# Patient Record
Sex: Female | Born: 1940 | Race: Black or African American | Hispanic: No | Marital: Single | Smoking: Current some day smoker
Health system: Southern US, Community
[De-identification: ages and names within clinical notes are randomized; demographics above are authoritative.]

## PROBLEM LIST (undated history)

## (undated) DIAGNOSIS — E7800 Pure hypercholesterolemia, unspecified: Secondary | ICD-10-CM

## (undated) DIAGNOSIS — R739 Hyperglycemia, unspecified: Secondary | ICD-10-CM

## (undated) DIAGNOSIS — I10 Essential (primary) hypertension: Secondary | ICD-10-CM

## (undated) DIAGNOSIS — M8580 Other specified disorders of bone density and structure, unspecified site: Secondary | ICD-10-CM

## (undated) DIAGNOSIS — Z8742 Personal history of other diseases of the female genital tract: Secondary | ICD-10-CM

## (undated) HISTORY — PX: APPENDECTOMY: SHX54

## (undated) HISTORY — PX: CATARACT EXTRACTION: SUR2

## (undated) HISTORY — DX: Hyperglycemia, unspecified: R73.9

## (undated) HISTORY — DX: Other specified disorders of bone density and structure, unspecified site: M85.80

## (undated) HISTORY — DX: Personal history of other diseases of the female genital tract: Z87.42

## (undated) HISTORY — DX: Pure hypercholesterolemia, unspecified: E78.00

## (undated) HISTORY — PX: MYOMECTOMY: SHX85

## (undated) HISTORY — DX: Essential (primary) hypertension: I10

---

## 1982-06-10 HISTORY — PX: ABDOMINAL HYSTERECTOMY: SHX81

## 2001-04-02 ENCOUNTER — Encounter: Payer: Self-pay | Admitting: Obstetrics and Gynecology

## 2001-04-02 ENCOUNTER — Encounter: Admission: RE | Admit: 2001-04-02 | Discharge: 2001-04-02 | Payer: Self-pay | Admitting: Obstetrics and Gynecology

## 2002-06-01 ENCOUNTER — Encounter: Admission: RE | Admit: 2002-06-01 | Discharge: 2002-06-01 | Payer: Self-pay | Admitting: Obstetrics and Gynecology

## 2002-06-01 ENCOUNTER — Encounter: Payer: Self-pay | Admitting: Obstetrics and Gynecology

## 2004-04-26 ENCOUNTER — Encounter: Admission: RE | Admit: 2004-04-26 | Discharge: 2004-04-26 | Payer: Self-pay | Admitting: Family Medicine

## 2004-09-25 ENCOUNTER — Encounter (HOSPITAL_COMMUNITY): Admission: RE | Admit: 2004-09-25 | Discharge: 2004-12-24 | Payer: Self-pay | Admitting: Family Medicine

## 2004-12-21 ENCOUNTER — Ambulatory Visit (HOSPITAL_COMMUNITY): Admission: RE | Admit: 2004-12-21 | Discharge: 2004-12-21 | Payer: Self-pay | Admitting: Gastroenterology

## 2005-07-22 ENCOUNTER — Encounter: Admission: RE | Admit: 2005-07-22 | Discharge: 2005-07-22 | Payer: Self-pay | Admitting: Obstetrics and Gynecology

## 2006-07-24 ENCOUNTER — Encounter: Admission: RE | Admit: 2006-07-24 | Discharge: 2006-07-24 | Payer: Self-pay | Admitting: Obstetrics and Gynecology

## 2007-07-30 ENCOUNTER — Encounter: Admission: RE | Admit: 2007-07-30 | Discharge: 2007-07-30 | Payer: Self-pay | Admitting: Obstetrics and Gynecology

## 2008-08-23 ENCOUNTER — Encounter: Admission: RE | Admit: 2008-08-23 | Discharge: 2008-08-23 | Payer: Self-pay | Admitting: Obstetrics and Gynecology

## 2009-08-24 ENCOUNTER — Encounter: Admission: RE | Admit: 2009-08-24 | Discharge: 2009-08-24 | Payer: Self-pay | Admitting: Obstetrics and Gynecology

## 2010-02-21 ENCOUNTER — Encounter: Admission: RE | Admit: 2010-02-21 | Discharge: 2010-03-09 | Payer: Self-pay | Admitting: Family Medicine

## 2010-07-25 ENCOUNTER — Other Ambulatory Visit: Payer: Self-pay | Admitting: Obstetrics and Gynecology

## 2010-07-25 DIAGNOSIS — Z1231 Encounter for screening mammogram for malignant neoplasm of breast: Secondary | ICD-10-CM

## 2010-08-28 ENCOUNTER — Ambulatory Visit
Admission: RE | Admit: 2010-08-28 | Discharge: 2010-08-28 | Disposition: A | Discharge status: Home / Self Care | Payer: Medicare Other | Source: Ambulatory Visit | Attending: Obstetrics and Gynecology | Admitting: Obstetrics and Gynecology

## 2010-08-28 DIAGNOSIS — Z1231 Encounter for screening mammogram for malignant neoplasm of breast: Secondary | ICD-10-CM

## 2011-07-03 ENCOUNTER — Encounter: Payer: Self-pay | Admitting: Gynecology

## 2011-07-03 ENCOUNTER — Ambulatory Visit (INDEPENDENT_AMBULATORY_CARE_PROVIDER_SITE_OTHER): Payer: Medicare Other | Admitting: Gynecology

## 2011-07-03 VITALS — BP 120/72 | Ht 66.5 in | Wt 195.0 lb

## 2011-07-03 DIAGNOSIS — Z79890 Hormone replacement therapy (postmenopausal): Secondary | ICD-10-CM | POA: Diagnosis not present

## 2011-07-03 DIAGNOSIS — N952 Postmenopausal atrophic vaginitis: Secondary | ICD-10-CM

## 2011-07-03 DIAGNOSIS — N9089 Other specified noninflammatory disorders of vulva and perineum: Secondary | ICD-10-CM | POA: Diagnosis not present

## 2011-07-03 DIAGNOSIS — R739 Hyperglycemia, unspecified: Secondary | ICD-10-CM | POA: Insufficient documentation

## 2011-07-03 DIAGNOSIS — B369 Superficial mycosis, unspecified: Secondary | ICD-10-CM

## 2011-07-03 DIAGNOSIS — Z8742 Personal history of other diseases of the female genital tract: Secondary | ICD-10-CM | POA: Insufficient documentation

## 2011-07-03 DIAGNOSIS — E7800 Pure hypercholesterolemia, unspecified: Secondary | ICD-10-CM | POA: Insufficient documentation

## 2011-07-03 DIAGNOSIS — Z780 Asymptomatic menopausal state: Secondary | ICD-10-CM

## 2011-07-03 MED ORDER — NYSTATIN-TRIAMCINOLONE 100000-0.1 UNIT/GM-% EX OINT: TOPICAL_OINTMENT | Freq: Two times a day (BID) | CUTANEOUS | Status: AC

## 2011-07-03 MED ORDER — ESTROGENS CONJUGATED 0.3 MG PO TABS: 0.3000 mg | ORAL_TABLET | Freq: Every day | ORAL | Status: AC

## 2011-07-03 NOTE — Progress Notes (Signed)
Denise Wilson 02-04-1941 [id number]        71 y.o.  New patient for follow up. Former patient of Dr. Leota Sauers. Several issues as noted below. 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  Past medical history,surgical history, medications, allergies, family history and social history were all reviewed and documented in the EPIC chart. ROS:  Was performed and pertinent positives and negatives are included in the history.  Exam: Sherrilyn Rist chaperone present Filed Vitals:   07/03/11 1411  BP: 120/72   General appearance  Normal Skin grossly normal Head/Neck normal with no cervical or supraclavicular adenopathy thyroid normal Lungs  clear Cardiac RR, without RMG Abdominal  soft, nontender, without masses, organomegaly or hernia Breasts  examined lying and sitting without masses, retractions, discharge or axillary adenopathy.  Under each breast classic yeast dermatitis. Pelvic  Ext/BUS/vagina  normal with atrophic changes. Small skin tag right lower labia majora  Adnexa  Without masses or tenderness    Anus and perineum  normal   Rectovaginal  normal sphincter tone without palpated masses or tenderness.    Assessment/Plan:  71 y.o. female for follow up.    1. Classic yeast dermatitis under both breasts. Treat with Mytrex cream nightly as needed. Follow up if persists 2. Atrophic vaginitis, asymptomatic. We'll continue to follow. 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 It is not a bother to the patient she'll continue to monitor. 4. ERT. Patient on low-dose Premarin 0.3 mg daily. Although she admits to not taking it daily. I reviewed the whole issue of ERT, WHI study with increased risk of stroke heart attack DVT possible breast cancer. The ACOG and NAMS statements for lowest dose for shortest period of time. I recommend she try to wean off and see how she does. I did refill times a year at her request just  in case she would start to have symptoms would want to restart this and she accepts the above risks. 5. Pap smear. No Pap smear was done today. She has numerous normal Pap smear results in her chart the last one in 2011. She has no history of abnormal Pap smears before.  She is status post hysterectomy for benign indication it is over 65. I reviewed current screening guidelines we both agreed to stop Pap smears at this time. 6. Mammography. She is due for mammography in March and I reminded her to schedule this. SBE monthly reviewed. 7. Colonoscopy. She had her colonoscopy 4-5 years ago and she will repeat this at the recommended interval. 8. Bone health. She's never had a bone density I scheduled one today. Increase calcium vitamin D reviewed. 9. Health maintenance. She's been followed for a number of medical conditions and actively sees her primary for this. The blood work was drawn today as is all done through their office.    Dara Lords MD, 2:49 PM 07/03/2011

## 2011-07-03 NOTE — Patient Instructions (Signed)
Follow up for bone density as scheduled.  Weaned from estrogen as we discussed.  Consider Stop Smoking.  Help is available at [facility] smoking cessation program @ [email or web address] or (514) 209-4842. OR 1-800-QUIT-NOW 979-551-6586) for free smoking cessation counseling.  Return for checkup in 1 year

## 2011-07-09 ENCOUNTER — Ambulatory Visit (INDEPENDENT_AMBULATORY_CARE_PROVIDER_SITE_OTHER): Payer: Medicare Other

## 2011-07-09 ENCOUNTER — Encounter: Payer: Self-pay | Admitting: Gynecology

## 2011-07-09 DIAGNOSIS — Z780 Asymptomatic menopausal state: Secondary | ICD-10-CM

## 2011-07-09 DIAGNOSIS — M899 Disorder of bone, unspecified: Secondary | ICD-10-CM

## 2011-07-09 DIAGNOSIS — M949 Disorder of cartilage, unspecified: Secondary | ICD-10-CM | POA: Diagnosis not present

## 2011-07-09 DIAGNOSIS — M8580 Other specified disorders of bone density and structure, unspecified site: Secondary | ICD-10-CM

## 2011-07-09 HISTORY — DX: Other specified disorders of bone density and structure, unspecified site: M85.80

## 2011-07-10 ENCOUNTER — Encounter: Payer: Self-pay | Admitting: Gynecology

## 2011-07-10 ENCOUNTER — Telehealth: Payer: Self-pay | Admitting: Gynecology

## 2011-07-10 DIAGNOSIS — M8580 Other specified disorders of bone density and structure, unspecified site: Secondary | ICD-10-CM

## 2011-07-10 NOTE — Telephone Encounter (Signed)
Pt informed with the below note and will make appointment. 

## 2011-07-10 NOTE — Telephone Encounter (Signed)
Tell patient that her bone density shows osteopenia approaching osteoporosis. I recommended she check a vitamin D level and then make an appointment to see me so that we can talk about whether we want to consider treatment or not.  I put the order in for the vitamin D level

## 2011-07-11 ENCOUNTER — Encounter: Payer: Self-pay | Admitting: Gynecology

## 2011-07-11 ENCOUNTER — Ambulatory Visit (INDEPENDENT_AMBULATORY_CARE_PROVIDER_SITE_OTHER): Payer: Medicare Other | Admitting: Gynecology

## 2011-07-11 DIAGNOSIS — M8580 Other specified disorders of bone density and structure, unspecified site: Secondary | ICD-10-CM

## 2011-07-11 DIAGNOSIS — M899 Disorder of bone, unspecified: Secondary | ICD-10-CM

## 2011-07-11 LAB — COMPREHENSIVE METABOLIC PANEL
Albumin: 4.7 g/dL (ref 3.5–5.2)
Chloride: 102 mEq/L (ref 96–112)
Sodium: 135 mEq/L (ref 135–145)
Total Protein: 8.3 g/dL (ref 6.0–8.3)

## 2011-07-11 NOTE — Progress Notes (Signed)
Patient presents to discuss her DEXA report. This was the first DEXA she ever had and it showed a T score of -2.4 at the left hip. FRAX showed a 10 year probability of fracture for major osteoporotic at 6% and hip fracture 2%. I reviewed all this with her. She does smoke and I reviewed the contributing factors of this. She does take extra vitamin D. The issues of whether to treat or not treat with a -2.4 T score but a favorable FRAX were reviewed and the risks benefits of treatments discussed. She does note that she was told that her calcium level was high when checked in her blood and I went ahead and ordered a vitamin D level PTH TSH and comprehensive metabolic panel. Patient will follow up for these results.  If all normal she'll continue with her extra vitamin D and we will plan on repeat DEXA in 2 years for a trending and then discuss treatment. Patient's comfortable with this plan.

## 2011-07-11 NOTE — Patient Instructions (Signed)
Follow up for laboratory studies. Assuming they are all normal then we'll plan on repeating the bone density test in 2 years

## 2011-07-15 ENCOUNTER — Telehealth: Payer: Self-pay | Admitting: *Deleted

## 2011-07-15 NOTE — Telephone Encounter (Signed)
Message copied by Libby Maw on Mon Jul 15, 2011  3:44 PM ------      Message from: Dara Lords      Created: Mon Jul 15, 2011 11:10 AM       Hyland Mollenkopf, tell patient that her one blood study that looks at her parathyroid hormone level was elevated. This sometimes can come from small benign tumors in the parathyroid. I want you to schedule a Sestamibi scan through nuclear medicine re: elevated parathyroid hormone and calcium level rule out parathyroid adenoma. Tell patient that I will ultimately be referring her to Dr. Darnell Level who is a surgeon who specializes in removing these types of tumors you can also go ahead and set up appointment to see him after the date the nuclear study is scheduled.

## 2011-07-15 NOTE — Telephone Encounter (Signed)
Lm for patient to call about results. 

## 2011-07-16 ENCOUNTER — Telehealth: Payer: Self-pay | Admitting: *Deleted

## 2011-07-16 NOTE — Telephone Encounter (Signed)
Message copied by Libby Maw on Tue Jul 16, 2011  9:14 AM ------      Message from: Dara Lords      Created: Mon Jul 15, 2011 11:10 AM       Nusrat Encarnacion, tell patient that her one blood study that looks at her parathyroid hormone level was elevated. This sometimes can come from small benign tumors in the parathyroid. I want you to schedule a Sestamibi scan through nuclear medicine re: elevated parathyroid hormone and calcium level rule out parathyroid adenoma. Tell patient that I will ultimately be referring her to Dr. Darnell Level who is a surgeon who specializes in removing these types of tumors you can also go ahead and set up appointment to see him after the date the nuclear study is scheduled.

## 2011-07-16 NOTE — Telephone Encounter (Signed)
Patient informed the information below.  Patient does not want to proceed with anything until she see's Dr. Parke Simmers her PCP.  I told patient it was very important to get this done as soon as possible.  Patient said she would call me back with her answer.

## 2011-07-17 ENCOUNTER — Other Ambulatory Visit: Payer: Self-pay | Admitting: *Deleted

## 2011-07-17 ENCOUNTER — Encounter: Payer: Self-pay | Admitting: Gynecology

## 2011-07-17 DIAGNOSIS — R7989 Other specified abnormal findings of blood chemistry: Secondary | ICD-10-CM

## 2011-07-17 NOTE — Telephone Encounter (Signed)
Patient called and decided to go with the scan after speaking with her PCP.  Scheduled scan for 07/24/11 @ 9:45 [facility].  We will await results per TF to see if she will need surgeon or endocrinologist.  Patient informed

## 2011-07-24 ENCOUNTER — Encounter (HOSPITAL_COMMUNITY)
Admission: RE | Admit: 2011-07-24 | Discharge: 2011-07-24 | Disposition: A | Discharge status: Home / Self Care | Payer: Medicare Other | Source: Ambulatory Visit | Attending: Gynecology | Admitting: Gynecology

## 2011-07-24 ENCOUNTER — Ambulatory Visit (HOSPITAL_COMMUNITY)
Admission: RE | Admit: 2011-07-24 | Discharge: 2011-07-24 | Disposition: A | Discharge status: Home / Self Care | Payer: Medicare Other | Source: Ambulatory Visit | Attending: Gynecology | Admitting: Gynecology

## 2011-07-24 DIAGNOSIS — R7989 Other specified abnormal findings of blood chemistry: Secondary | ICD-10-CM

## 2011-07-24 DIAGNOSIS — R946 Abnormal results of thyroid function studies: Secondary | ICD-10-CM | POA: Insufficient documentation

## 2011-07-24 DIAGNOSIS — E349 Endocrine disorder, unspecified: Secondary | ICD-10-CM | POA: Diagnosis not present

## 2011-07-24 MED ORDER — TECHNETIUM TC 99M SESTAMIBI - CARDIOLITE
25.0000 | Freq: Once | INTRAVENOUS | Status: AC | PRN
  Administered 2011-07-24: 10:00:00 25 via INTRAVENOUS

## 2011-08-01 ENCOUNTER — Other Ambulatory Visit: Payer: Self-pay | Admitting: *Deleted

## 2011-08-01 DIAGNOSIS — D351 Benign neoplasm of parathyroid gland: Secondary | ICD-10-CM

## 2011-08-15 DIAGNOSIS — H2510 Age-related nuclear cataract, unspecified eye: Secondary | ICD-10-CM | POA: Diagnosis not present

## 2011-08-15 DIAGNOSIS — H31019 Macula scars of posterior pole (postinflammatory) (post-traumatic), unspecified eye: Secondary | ICD-10-CM | POA: Diagnosis not present

## 2011-08-21 DIAGNOSIS — H2510 Age-related nuclear cataract, unspecified eye: Secondary | ICD-10-CM | POA: Diagnosis not present

## 2011-08-26 ENCOUNTER — Other Ambulatory Visit (INDEPENDENT_AMBULATORY_CARE_PROVIDER_SITE_OTHER): Payer: Self-pay | Admitting: Surgery

## 2011-08-26 ENCOUNTER — Encounter (INDEPENDENT_AMBULATORY_CARE_PROVIDER_SITE_OTHER): Payer: Self-pay | Admitting: Surgery

## 2011-08-26 ENCOUNTER — Ambulatory Visit (INDEPENDENT_AMBULATORY_CARE_PROVIDER_SITE_OTHER): Payer: Medicare Other | Admitting: Surgery

## 2011-08-26 VITALS — BP 132/74 | HR 64 | Temp 98.3°F | Resp 18 | Ht 67.0 in | Wt 196.6 lb

## 2011-08-26 DIAGNOSIS — R221 Localized swelling, mass and lump, neck: Secondary | ICD-10-CM | POA: Diagnosis not present

## 2011-08-26 DIAGNOSIS — E210 Primary hyperparathyroidism: Secondary | ICD-10-CM | POA: Diagnosis not present

## 2011-08-26 DIAGNOSIS — R220 Localized swelling, mass and lump, head: Secondary | ICD-10-CM | POA: Diagnosis not present

## 2011-08-26 DIAGNOSIS — D351 Benign neoplasm of parathyroid gland: Secondary | ICD-10-CM

## 2011-08-26 DIAGNOSIS — E215 Disorder of parathyroid gland, unspecified: Secondary | ICD-10-CM

## 2011-08-26 DIAGNOSIS — K118 Other diseases of salivary glands: Secondary | ICD-10-CM | POA: Insufficient documentation

## 2011-08-26 NOTE — Progress Notes (Signed)
Chief Complaint  Patient presents with  . New Evaluation    eval primary hyperparathyroidism - referral from Dr. Caesar Chestnut    HISTORY: Patient is a 71 year old black female with a long-standing history of hypercalcemia. Patient was recently evaluated by her gynecologist. Laboratory studies showed an elevated serum calcium level of 11.5. Intact PTH level was markedly elevated at 191.0. The patient underwent a bone density scan which was normal. Patient was then referred for nuclear medicine parathyroid scan which localized an area of increased activity to the left inferior position. Patient is referred to general surgery at this time for consideration for minimally invasive parathyroidectomy.  Patient has no history of prior head or neck surgery. She does note a mass in the area of the angle of the mandible on the left. This has occurred in the past. He seems to be exacerbated by sour foods. She has not had this previously evaluated.  Past Medical History  Diagnosis Date  . Hypertension   . High cholesterol   . Elevated blood sugar   . History of PID   . Osteopenia 07/09/2011    t score -2.4     Current Outpatient Prescriptions  Medication Sig Dispense Refill  . amLODipine-benazepril (LOTREL) 10-20 MG per capsule Take 1 capsule by mouth daily.      Marland Kitchen BROMDAY 0.09 % SOLN daily.      . calcium carbonate (OS-CAL) 1250 MG chewable tablet Chew 1 tablet by mouth daily.      . cholecalciferol (VITAMIN D) 1000 UNITS tablet Take 1,000 Units by mouth daily.      . COD LIVER OIL PO Take by mouth.      . estrogens, conjugated, (PREMARIN) 0.3 MG tablet Take 1 tablet (0.3 mg total) by mouth daily. Take daily for 21 days then do not take for 7 days.  30 tablet  11  . fish oil-omega-3 fatty acids 1000 MG capsule Take 2 g by mouth daily.      Marland Kitchen losartan-hydrochlorothiazide (HYZAAR) 100-25 MG per tablet Take 1 tablet by mouth daily.      Marland Kitchen nystatin-triamcinolone ointment (MYCOLOG) Apply topically 2  (two) times daily.  30 g  0  . ofloxacin (OCUFLOX) 0.3 % ophthalmic solution daily.      . rosuvastatin (CRESTOR) 5 MG tablet Take 5 mg by mouth daily.      . sitaGLIPtin (JANUVIA) 100 MG tablet Take 100 mg by mouth daily.      . Vitamin D, Ergocalciferol, (DRISDOL) 50000 UNITS CAPS Take 50,000 Units by mouth.         No Known Allergies   Family History  Problem Relation Age of Onset  . Hypertension Father   . Cancer Maternal Uncle     [place]     History   Social History  . Marital Status: Single    Spouse Name: N/A    Number of Children: N/A  . Years of Education: N/A   Social History Main Topics  . Smoking status: Current Some Day Smoker  . Smokeless tobacco: Never Used  . Alcohol Use: No  . Drug Use: No  . Sexually Active: No   Other Topics Concern  . None   Social History Narrative  . None     REVIEW OF SYSTEMS - PERTINENT POSITIVES ONLY: Denies nephrolithiasis. Denies fatigue. Denies bone or joint pain.  EXAM: Filed Vitals:   08/26/11 0932  BP: 132/74  Pulse: 64  Temp: 98.3 F (36.8 C)  Resp: 18  HEENT: normocephalic; pupils equal and reactive; sclerae clear; dentition good; mucous membranes moist NECK:  Palpable enlargement of the tail of the left parotid gland with mild tenderness. No discrete mass. No significant lymphadenopathy; symmetric on extension; no palpable anterior or posterior cervical lymphadenopathy; no supraclavicular masses; no tenderness CHEST: clear to auscultation bilaterally without rales, rhonchi, or wheezes CARDIAC: regular rate and rhythm without significant murmur; peripheral pulses are full ABDOMEN: soft without distension; bowel sounds present; no mass; no hepatosplenomegaly; no hernia EXT:  non-tender without edema; no deformity NEURO: no gross focal deficits; mild tremor in right hand only   LABORATORY RESULTS: See [facility] (CHL-Epic) for most recent results   RADIOLOGY RESULTS: See [facility]  (CHL-Epic) for most recent results   IMPRESSION: #1 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 I will order an MRI scan of the neck to better evaluate the left parotid mass as well as confirm the presence of a left inferior parathyroid adenoma. Once the study is completed I will review the results and contact the patient. If there is a significant lesion in the left parotid gland, I will make referral to ENT for further evaluation and recommendations.  Patient will require minimally invasive parathyroidectomy, likely a left inferior parathyroid adenoma. We will not schedule this procedure until the left parotid mass is more formally evaluated. There may be the possibility of a combined procedure.  Velora Heckler, MD, FACS General & Endocrine Surgery [facility]   Visit Diagnoses: 1. Hyperparathyroidism, primary   2. Mass of parotid gland, left     Primary Care Physician: Geraldo Pitter, MD, MD  Gyn:  Dr. Edyth Gunnels

## 2011-08-26 NOTE — Patient Instructions (Signed)
[facility] [phone]  THYROID & PARATHYROID SURGERY -- POST OP INSTRUCTIONS  Always review your discharge instruction sheet from the facility where your surgery was performed.  1. A prescription for pain medication may be given to you upon discharge.  Take your pain medication as prescribed, if needed.  If narcotic pain medicine is not needed, then you may take acetaminophen (Tylenol) or ibuprofen (Advil) as needed. 2. Take your usually prescribed medications unless otherwise directed. 3. If you need a refill on your pain medication, please contact your pharmacy. They will contact our office to request authorization.  Prescriptions will not be processed after 5 pm or on weekends. 4. Start with a light diet upon arrival home, such as soup and crackers, etc.  Be sure to drink pleny of fluids daily.  Resume your normal diet the day after surgery. 5. Most patients will experience some swelling and bruising on the chest and neck area.  Ice packs will help.  Swelling and bruising can take several days to resolve.  6. It is common to experience some constipation if taking pain medication after surgery.  Increasing fluid intake and taking a stool softener will usually help or prevent this problem.  A mild laxative (Milk of Magnesia or Miralax) should be taken according to package directions if there are no bowel movements after 48 hours. 7. You may remove your bandages 24-48 hours after surgery, and you may shower at that time.  You have steri-strips (small skin tapes) in place directly over the incision.  These strips should be left on the skin for 7-10 days and then removed. 8. You may resume regular (light) daily activities beginning the next day-such as daily self-care, walking, climbing stairs-gradually increasing activities as tolerated.  You may have sexual intercourse when it is comfortable.  Refrain from any heavy lifting or straining until approved by your doctor.  You may drive  when you no longer are taking prescription pain medication, you can comfortably wear a seatbelt, and you can safely maneuver your car and apply brakes. 9. You should see your doctor in the office for a follow-up appointment approximately two weeks after your surgery.  Make sure that you call for this appointment within a day or two after you arrive home to insure a convenient appointment time.  WHEN TO CALL YOUR DOCTOR: 1. Fever over 101.5 2. Inability to urinate 3. Nausea and/or vomiting - persistent 4. Extreme swelling or bruising 5. Continued bleeding from incision 6. Increased pain, redness, or drainage from the incision 7. Difficulty swallowing or breathing 8. Muscle cramping or spasms 9. Numbness or tingling in hands or feet or around lips  The clinic staff is available to answer your questions during regular business hours.  Please don't hesitate to call and ask to speak to one of the nurses if you have concerns.  [email or web address]   

## 2011-08-30 ENCOUNTER — Other Ambulatory Visit: Payer: Medicare Other

## 2011-09-17 DIAGNOSIS — H35059 Retinal neovascularization, unspecified, unspecified eye: Secondary | ICD-10-CM | POA: Diagnosis not present

## 2011-09-17 DIAGNOSIS — H359 Unspecified retinal disorder: Secondary | ICD-10-CM | POA: Diagnosis not present

## 2011-09-17 DIAGNOSIS — H3560 Retinal hemorrhage, unspecified eye: Secondary | ICD-10-CM | POA: Diagnosis not present

## 2011-09-17 DIAGNOSIS — H353290 Exudative age-related macular degeneration, unspecified eye, stage unspecified: Secondary | ICD-10-CM | POA: Diagnosis not present

## 2011-09-18 DIAGNOSIS — H3560 Retinal hemorrhage, unspecified eye: Secondary | ICD-10-CM | POA: Diagnosis not present

## 2011-09-18 DIAGNOSIS — H353290 Exudative age-related macular degeneration, unspecified eye, stage unspecified: Secondary | ICD-10-CM | POA: Diagnosis not present

## 2011-09-18 DIAGNOSIS — H35059 Retinal neovascularization, unspecified, unspecified eye: Secondary | ICD-10-CM | POA: Diagnosis not present

## 2011-09-24 DIAGNOSIS — E119 Type 2 diabetes mellitus without complications: Secondary | ICD-10-CM | POA: Diagnosis not present

## 2011-09-24 DIAGNOSIS — R4182 Altered mental status, unspecified: Secondary | ICD-10-CM | POA: Diagnosis not present

## 2011-09-24 DIAGNOSIS — I10 Essential (primary) hypertension: Secondary | ICD-10-CM | POA: Diagnosis not present

## 2011-09-24 DIAGNOSIS — E1110 Type 2 diabetes mellitus with ketoacidosis without coma: Secondary | ICD-10-CM | POA: Diagnosis not present

## 2011-09-27 ENCOUNTER — Telehealth (INDEPENDENT_AMBULATORY_CARE_PROVIDER_SITE_OTHER): Payer: Self-pay

## 2011-09-27 NOTE — Telephone Encounter (Signed)
Patient having problems with following cataract surgery.  She will call to reschedule MRI and office appointment in the future. She will cancel appointment on 10/02/2011.

## 2011-10-02 ENCOUNTER — Encounter (INDEPENDENT_AMBULATORY_CARE_PROVIDER_SITE_OTHER): Payer: Medicare Other | Admitting: Surgery

## 2011-10-23 DIAGNOSIS — H35059 Retinal neovascularization, unspecified, unspecified eye: Secondary | ICD-10-CM | POA: Diagnosis not present

## 2011-10-23 DIAGNOSIS — H353290 Exudative age-related macular degeneration, unspecified eye, stage unspecified: Secondary | ICD-10-CM | POA: Diagnosis not present

## 2011-11-27 DIAGNOSIS — H353290 Exudative age-related macular degeneration, unspecified eye, stage unspecified: Secondary | ICD-10-CM | POA: Diagnosis not present

## 2011-11-27 DIAGNOSIS — H35059 Retinal neovascularization, unspecified, unspecified eye: Secondary | ICD-10-CM | POA: Diagnosis not present

## 2012-01-01 DIAGNOSIS — H35059 Retinal neovascularization, unspecified, unspecified eye: Secondary | ICD-10-CM | POA: Diagnosis not present

## 2012-01-01 DIAGNOSIS — H353290 Exudative age-related macular degeneration, unspecified eye, stage unspecified: Secondary | ICD-10-CM | POA: Diagnosis not present

## 2012-01-22 DIAGNOSIS — E119 Type 2 diabetes mellitus without complications: Secondary | ICD-10-CM | POA: Diagnosis not present

## 2012-01-22 DIAGNOSIS — E7800 Pure hypercholesterolemia, unspecified: Secondary | ICD-10-CM | POA: Diagnosis not present

## 2012-01-22 DIAGNOSIS — E1110 Type 2 diabetes mellitus with ketoacidosis without coma: Secondary | ICD-10-CM | POA: Diagnosis not present

## 2012-01-22 DIAGNOSIS — I10 Essential (primary) hypertension: Secondary | ICD-10-CM | POA: Diagnosis not present

## 2012-01-28 DIAGNOSIS — R0602 Shortness of breath: Secondary | ICD-10-CM | POA: Diagnosis not present

## 2012-02-12 DIAGNOSIS — H35359 Cystoid macular degeneration, unspecified eye: Secondary | ICD-10-CM | POA: Diagnosis not present

## 2012-02-12 DIAGNOSIS — H35059 Retinal neovascularization, unspecified, unspecified eye: Secondary | ICD-10-CM | POA: Diagnosis not present

## 2012-02-12 DIAGNOSIS — H353290 Exudative age-related macular degeneration, unspecified eye, stage unspecified: Secondary | ICD-10-CM | POA: Diagnosis not present

## 2012-03-25 DIAGNOSIS — H353290 Exudative age-related macular degeneration, unspecified eye, stage unspecified: Secondary | ICD-10-CM | POA: Diagnosis not present

## 2012-03-25 DIAGNOSIS — H35059 Retinal neovascularization, unspecified, unspecified eye: Secondary | ICD-10-CM | POA: Diagnosis not present

## 2012-04-29 DIAGNOSIS — H353290 Exudative age-related macular degeneration, unspecified eye, stage unspecified: Secondary | ICD-10-CM | POA: Diagnosis not present

## 2012-04-29 DIAGNOSIS — H35059 Retinal neovascularization, unspecified, unspecified eye: Secondary | ICD-10-CM | POA: Diagnosis not present

## 2012-05-21 DIAGNOSIS — E7800 Pure hypercholesterolemia, unspecified: Secondary | ICD-10-CM | POA: Diagnosis not present

## 2012-05-21 DIAGNOSIS — E119 Type 2 diabetes mellitus without complications: Secondary | ICD-10-CM | POA: Diagnosis not present

## 2012-05-21 DIAGNOSIS — I10 Essential (primary) hypertension: Secondary | ICD-10-CM | POA: Diagnosis not present

## 2012-05-21 DIAGNOSIS — Z23 Encounter for immunization: Secondary | ICD-10-CM | POA: Diagnosis not present

## 2012-06-08 DIAGNOSIS — H35059 Retinal neovascularization, unspecified, unspecified eye: Secondary | ICD-10-CM | POA: Diagnosis not present

## 2012-06-08 DIAGNOSIS — H353290 Exudative age-related macular degeneration, unspecified eye, stage unspecified: Secondary | ICD-10-CM | POA: Diagnosis not present

## 2012-06-08 DIAGNOSIS — H35359 Cystoid macular degeneration, unspecified eye: Secondary | ICD-10-CM | POA: Diagnosis not present

## 2012-07-13 DIAGNOSIS — H35059 Retinal neovascularization, unspecified, unspecified eye: Secondary | ICD-10-CM | POA: Diagnosis not present

## 2012-07-13 DIAGNOSIS — H353290 Exudative age-related macular degeneration, unspecified eye, stage unspecified: Secondary | ICD-10-CM | POA: Diagnosis not present

## 2012-08-19 DIAGNOSIS — H35359 Cystoid macular degeneration, unspecified eye: Secondary | ICD-10-CM | POA: Diagnosis not present

## 2012-08-19 DIAGNOSIS — H35059 Retinal neovascularization, unspecified, unspecified eye: Secondary | ICD-10-CM | POA: Diagnosis not present

## 2012-09-10 DIAGNOSIS — E119 Type 2 diabetes mellitus without complications: Secondary | ICD-10-CM | POA: Diagnosis not present

## 2012-09-10 DIAGNOSIS — I10 Essential (primary) hypertension: Secondary | ICD-10-CM | POA: Diagnosis not present

## 2012-09-24 DIAGNOSIS — E119 Type 2 diabetes mellitus without complications: Secondary | ICD-10-CM | POA: Diagnosis not present

## 2012-09-24 DIAGNOSIS — E7800 Pure hypercholesterolemia, unspecified: Secondary | ICD-10-CM | POA: Diagnosis not present

## 2012-09-24 DIAGNOSIS — I10 Essential (primary) hypertension: Secondary | ICD-10-CM | POA: Diagnosis not present

## 2012-09-30 DIAGNOSIS — H35059 Retinal neovascularization, unspecified, unspecified eye: Secondary | ICD-10-CM | POA: Diagnosis not present

## 2012-09-30 DIAGNOSIS — H353290 Exudative age-related macular degeneration, unspecified eye, stage unspecified: Secondary | ICD-10-CM | POA: Diagnosis not present

## 2012-09-30 DIAGNOSIS — H35359 Cystoid macular degeneration, unspecified eye: Secondary | ICD-10-CM | POA: Diagnosis not present

## 2012-10-27 DIAGNOSIS — E119 Type 2 diabetes mellitus without complications: Secondary | ICD-10-CM | POA: Diagnosis not present

## 2012-10-27 DIAGNOSIS — I10 Essential (primary) hypertension: Secondary | ICD-10-CM | POA: Diagnosis not present

## 2012-10-30 ENCOUNTER — Other Ambulatory Visit: Payer: Self-pay

## 2012-10-30 DIAGNOSIS — Z1231 Encounter for screening mammogram for malignant neoplasm of breast: Secondary | ICD-10-CM

## 2012-11-11 DIAGNOSIS — H353290 Exudative age-related macular degeneration, unspecified eye, stage unspecified: Secondary | ICD-10-CM | POA: Diagnosis not present

## 2012-11-11 DIAGNOSIS — H35059 Retinal neovascularization, unspecified, unspecified eye: Secondary | ICD-10-CM | POA: Diagnosis not present

## 2012-11-11 DIAGNOSIS — H35359 Cystoid macular degeneration, unspecified eye: Secondary | ICD-10-CM | POA: Diagnosis not present

## 2012-12-01 ENCOUNTER — Ambulatory Visit
Admission: RE | Admit: 2012-12-01 | Discharge: 2012-12-01 | Disposition: A | Discharge status: Home / Self Care | Payer: Medicare Other | Source: Ambulatory Visit

## 2012-12-01 DIAGNOSIS — Z1231 Encounter for screening mammogram for malignant neoplasm of breast: Secondary | ICD-10-CM

## 2012-12-30 DIAGNOSIS — H35359 Cystoid macular degeneration, unspecified eye: Secondary | ICD-10-CM | POA: Diagnosis not present

## 2012-12-30 DIAGNOSIS — H353290 Exudative age-related macular degeneration, unspecified eye, stage unspecified: Secondary | ICD-10-CM | POA: Diagnosis not present

## 2012-12-30 DIAGNOSIS — H35059 Retinal neovascularization, unspecified, unspecified eye: Secondary | ICD-10-CM | POA: Diagnosis not present

## 2013-02-17 DIAGNOSIS — H35059 Retinal neovascularization, unspecified, unspecified eye: Secondary | ICD-10-CM | POA: Diagnosis not present

## 2013-02-17 DIAGNOSIS — H353290 Exudative age-related macular degeneration, unspecified eye, stage unspecified: Secondary | ICD-10-CM | POA: Diagnosis not present

## 2013-02-24 DIAGNOSIS — E119 Type 2 diabetes mellitus without complications: Secondary | ICD-10-CM | POA: Diagnosis not present

## 2013-02-24 DIAGNOSIS — I10 Essential (primary) hypertension: Secondary | ICD-10-CM | POA: Diagnosis not present

## 2013-02-24 DIAGNOSIS — E7800 Pure hypercholesterolemia, unspecified: Secondary | ICD-10-CM | POA: Diagnosis not present

## 2013-04-14 DIAGNOSIS — H35359 Cystoid macular degeneration, unspecified eye: Secondary | ICD-10-CM | POA: Diagnosis not present

## 2013-04-14 DIAGNOSIS — H35059 Retinal neovascularization, unspecified, unspecified eye: Secondary | ICD-10-CM | POA: Diagnosis not present

## 2013-05-26 DIAGNOSIS — H35359 Cystoid macular degeneration, unspecified eye: Secondary | ICD-10-CM | POA: Diagnosis not present

## 2013-05-26 DIAGNOSIS — H353290 Exudative age-related macular degeneration, unspecified eye, stage unspecified: Secondary | ICD-10-CM | POA: Diagnosis not present

## 2013-06-24 DIAGNOSIS — I10 Essential (primary) hypertension: Secondary | ICD-10-CM | POA: Diagnosis not present

## 2013-06-24 DIAGNOSIS — E119 Type 2 diabetes mellitus without complications: Secondary | ICD-10-CM | POA: Diagnosis not present

## 2013-06-24 DIAGNOSIS — E7800 Pure hypercholesterolemia, unspecified: Secondary | ICD-10-CM | POA: Diagnosis not present

## 2013-07-07 DIAGNOSIS — H353290 Exudative age-related macular degeneration, unspecified eye, stage unspecified: Secondary | ICD-10-CM | POA: Diagnosis not present

## 2013-07-07 DIAGNOSIS — H35059 Retinal neovascularization, unspecified, unspecified eye: Secondary | ICD-10-CM | POA: Diagnosis not present

## 2013-08-25 DIAGNOSIS — H353290 Exudative age-related macular degeneration, unspecified eye, stage unspecified: Secondary | ICD-10-CM | POA: Diagnosis not present

## 2013-08-25 DIAGNOSIS — H35059 Retinal neovascularization, unspecified, unspecified eye: Secondary | ICD-10-CM | POA: Diagnosis not present

## 2013-10-13 DIAGNOSIS — H35359 Cystoid macular degeneration, unspecified eye: Secondary | ICD-10-CM | POA: Diagnosis not present

## 2013-10-13 DIAGNOSIS — H353290 Exudative age-related macular degeneration, unspecified eye, stage unspecified: Secondary | ICD-10-CM | POA: Diagnosis not present

## 2013-10-13 DIAGNOSIS — H35059 Retinal neovascularization, unspecified, unspecified eye: Secondary | ICD-10-CM | POA: Diagnosis not present

## 2013-10-22 DIAGNOSIS — E119 Type 2 diabetes mellitus without complications: Secondary | ICD-10-CM | POA: Diagnosis not present

## 2013-10-28 DIAGNOSIS — I10 Essential (primary) hypertension: Secondary | ICD-10-CM | POA: Diagnosis not present

## 2013-10-28 DIAGNOSIS — E119 Type 2 diabetes mellitus without complications: Secondary | ICD-10-CM | POA: Diagnosis not present

## 2013-10-28 DIAGNOSIS — E7800 Pure hypercholesterolemia, unspecified: Secondary | ICD-10-CM | POA: Diagnosis not present

## 2013-11-09 ENCOUNTER — Other Ambulatory Visit: Payer: Self-pay

## 2013-11-09 DIAGNOSIS — Z1231 Encounter for screening mammogram for malignant neoplasm of breast: Secondary | ICD-10-CM

## 2013-12-01 DIAGNOSIS — H353290 Exudative age-related macular degeneration, unspecified eye, stage unspecified: Secondary | ICD-10-CM | POA: Diagnosis not present

## 2013-12-01 DIAGNOSIS — H35059 Retinal neovascularization, unspecified, unspecified eye: Secondary | ICD-10-CM | POA: Diagnosis not present

## 2013-12-02 ENCOUNTER — Encounter (INDEPENDENT_AMBULATORY_CARE_PROVIDER_SITE_OTHER): Payer: Self-pay

## 2013-12-02 ENCOUNTER — Ambulatory Visit
Admission: RE | Admit: 2013-12-02 | Discharge: 2013-12-02 | Disposition: A | Discharge status: Home / Self Care | Payer: Medicare Other | Source: Ambulatory Visit

## 2013-12-02 DIAGNOSIS — Z1231 Encounter for screening mammogram for malignant neoplasm of breast: Secondary | ICD-10-CM

## 2014-01-26 DIAGNOSIS — H353290 Exudative age-related macular degeneration, unspecified eye, stage unspecified: Secondary | ICD-10-CM | POA: Diagnosis not present

## 2014-01-26 DIAGNOSIS — H35059 Retinal neovascularization, unspecified, unspecified eye: Secondary | ICD-10-CM | POA: Diagnosis not present

## 2014-03-23 DIAGNOSIS — H35052 Retinal neovascularization, unspecified, left eye: Secondary | ICD-10-CM | POA: Diagnosis not present

## 2014-03-23 DIAGNOSIS — H3532 Exudative age-related macular degeneration: Secondary | ICD-10-CM | POA: Diagnosis not present

## 2014-03-30 DIAGNOSIS — E0800 Diabetes mellitus due to underlying condition with hyperosmolarity without nonketotic hyperglycemic-hyperosmolar coma (NKHHC): Secondary | ICD-10-CM | POA: Diagnosis not present

## 2014-03-30 DIAGNOSIS — R635 Abnormal weight gain: Secondary | ICD-10-CM | POA: Diagnosis not present

## 2014-03-30 DIAGNOSIS — Z23 Encounter for immunization: Secondary | ICD-10-CM | POA: Diagnosis not present

## 2014-03-30 DIAGNOSIS — I10 Essential (primary) hypertension: Secondary | ICD-10-CM | POA: Diagnosis not present

## 2014-03-30 DIAGNOSIS — E782 Mixed hyperlipidemia: Secondary | ICD-10-CM | POA: Diagnosis not present

## 2014-04-11 ENCOUNTER — Encounter (INDEPENDENT_AMBULATORY_CARE_PROVIDER_SITE_OTHER): Payer: Self-pay | Admitting: Surgery

## 2014-04-13 DIAGNOSIS — Z0000 Encounter for general adult medical examination without abnormal findings: Secondary | ICD-10-CM | POA: Diagnosis not present

## 2014-05-19 DIAGNOSIS — H35052 Retinal neovascularization, unspecified, left eye: Secondary | ICD-10-CM | POA: Diagnosis not present

## 2014-05-19 DIAGNOSIS — H3532 Exudative age-related macular degeneration: Secondary | ICD-10-CM | POA: Diagnosis not present

## 2014-07-28 DIAGNOSIS — H35052 Retinal neovascularization, unspecified, left eye: Secondary | ICD-10-CM | POA: Diagnosis not present

## 2014-07-28 DIAGNOSIS — H3532 Exudative age-related macular degeneration: Secondary | ICD-10-CM | POA: Diagnosis not present

## 2014-08-03 DIAGNOSIS — I10 Essential (primary) hypertension: Secondary | ICD-10-CM | POA: Diagnosis not present

## 2014-08-03 DIAGNOSIS — E0800 Diabetes mellitus due to underlying condition with hyperosmolarity without nonketotic hyperglycemic-hyperosmolar coma (NKHHC): Secondary | ICD-10-CM | POA: Diagnosis not present

## 2014-08-03 DIAGNOSIS — L02232 Carbuncle of back [any part, except buttock]: Secondary | ICD-10-CM | POA: Diagnosis not present

## 2014-08-10 DIAGNOSIS — L02232 Carbuncle of back [any part, except buttock]: Secondary | ICD-10-CM | POA: Diagnosis not present

## 2014-09-13 DIAGNOSIS — Z0000 Encounter for general adult medical examination without abnormal findings: Secondary | ICD-10-CM | POA: Diagnosis not present

## 2014-09-29 DIAGNOSIS — H3532 Exudative age-related macular degeneration: Secondary | ICD-10-CM | POA: Diagnosis not present

## 2014-11-15 ENCOUNTER — Other Ambulatory Visit: Payer: Self-pay

## 2014-11-15 DIAGNOSIS — Z1231 Encounter for screening mammogram for malignant neoplasm of breast: Secondary | ICD-10-CM

## 2014-12-05 ENCOUNTER — Ambulatory Visit
Admission: RE | Admit: 2014-12-05 | Discharge: 2014-12-05 | Disposition: A | Discharge status: Home / Self Care | Payer: Medicare Other | Source: Ambulatory Visit

## 2014-12-05 DIAGNOSIS — Z1231 Encounter for screening mammogram for malignant neoplasm of breast: Secondary | ICD-10-CM | POA: Diagnosis not present

## 2014-12-14 DIAGNOSIS — H3532 Exudative age-related macular degeneration: Secondary | ICD-10-CM | POA: Diagnosis not present

## 2015-03-22 DIAGNOSIS — H353221 Exudative age-related macular degeneration, left eye, with active choroidal neovascularization: Secondary | ICD-10-CM | POA: Diagnosis not present

## 2015-03-30 DIAGNOSIS — Z23 Encounter for immunization: Secondary | ICD-10-CM | POA: Diagnosis not present

## 2015-03-30 DIAGNOSIS — E0800 Diabetes mellitus due to underlying condition with hyperosmolarity without nonketotic hyperglycemic-hyperosmolar coma (NKHHC): Secondary | ICD-10-CM | POA: Diagnosis not present

## 2015-03-30 DIAGNOSIS — E782 Mixed hyperlipidemia: Secondary | ICD-10-CM | POA: Diagnosis not present

## 2015-03-30 DIAGNOSIS — I10 Essential (primary) hypertension: Secondary | ICD-10-CM | POA: Diagnosis not present

## 2015-05-15 DIAGNOSIS — H2511 Age-related nuclear cataract, right eye: Secondary | ICD-10-CM | POA: Diagnosis not present

## 2015-05-15 DIAGNOSIS — Z961 Presence of intraocular lens: Secondary | ICD-10-CM | POA: Diagnosis not present

## 2015-05-15 DIAGNOSIS — H25011 Cortical age-related cataract, right eye: Secondary | ICD-10-CM | POA: Diagnosis not present

## 2015-05-15 DIAGNOSIS — H353123 Nonexudative age-related macular degeneration, left eye, advanced atrophic without subfoveal involvement: Secondary | ICD-10-CM | POA: Diagnosis not present

## 2015-06-21 DIAGNOSIS — H353114 Nonexudative age-related macular degeneration, right eye, advanced atrophic with subfoveal involvement: Secondary | ICD-10-CM | POA: Diagnosis not present

## 2015-06-21 DIAGNOSIS — H353123 Nonexudative age-related macular degeneration, left eye, advanced atrophic without subfoveal involvement: Secondary | ICD-10-CM | POA: Diagnosis not present

## 2015-06-21 DIAGNOSIS — H353221 Exudative age-related macular degeneration, left eye, with active choroidal neovascularization: Secondary | ICD-10-CM | POA: Diagnosis not present

## 2015-06-21 DIAGNOSIS — H2511 Age-related nuclear cataract, right eye: Secondary | ICD-10-CM | POA: Diagnosis not present

## 2015-06-28 DIAGNOSIS — E782 Mixed hyperlipidemia: Secondary | ICD-10-CM | POA: Diagnosis not present

## 2015-06-28 DIAGNOSIS — E089 Diabetes mellitus due to underlying condition without complications: Secondary | ICD-10-CM | POA: Diagnosis not present

## 2015-06-28 DIAGNOSIS — I10 Essential (primary) hypertension: Secondary | ICD-10-CM | POA: Diagnosis not present

## 2015-06-28 DIAGNOSIS — F4329 Adjustment disorder with other symptoms: Secondary | ICD-10-CM | POA: Diagnosis not present

## 2015-08-23 DIAGNOSIS — H353221 Exudative age-related macular degeneration, left eye, with active choroidal neovascularization: Secondary | ICD-10-CM | POA: Diagnosis not present

## 2015-10-26 DIAGNOSIS — E782 Mixed hyperlipidemia: Secondary | ICD-10-CM | POA: Diagnosis not present

## 2015-10-26 DIAGNOSIS — I10 Essential (primary) hypertension: Secondary | ICD-10-CM | POA: Diagnosis not present

## 2015-10-26 DIAGNOSIS — E089 Diabetes mellitus due to underlying condition without complications: Secondary | ICD-10-CM | POA: Diagnosis not present

## 2015-11-20 ENCOUNTER — Other Ambulatory Visit: Payer: Self-pay | Admitting: Family Medicine

## 2015-11-20 DIAGNOSIS — Z1231 Encounter for screening mammogram for malignant neoplasm of breast: Secondary | ICD-10-CM

## 2015-11-23 DIAGNOSIS — H353221 Exudative age-related macular degeneration, left eye, with active choroidal neovascularization: Secondary | ICD-10-CM | POA: Diagnosis not present

## 2015-12-07 ENCOUNTER — Ambulatory Visit
Admission: RE | Admit: 2015-12-07 | Discharge: 2015-12-07 | Disposition: A | Discharge status: Home / Self Care | Payer: Medicare Other | Source: Ambulatory Visit | Attending: Family Medicine | Admitting: Family Medicine

## 2015-12-07 DIAGNOSIS — Z1231 Encounter for screening mammogram for malignant neoplasm of breast: Secondary | ICD-10-CM

## 2016-02-22 DIAGNOSIS — E089 Diabetes mellitus due to underlying condition without complications: Secondary | ICD-10-CM | POA: Diagnosis not present

## 2016-02-22 DIAGNOSIS — I10 Essential (primary) hypertension: Secondary | ICD-10-CM | POA: Diagnosis not present

## 2016-02-22 DIAGNOSIS — E782 Mixed hyperlipidemia: Secondary | ICD-10-CM | POA: Diagnosis not present

## 2016-02-27 DIAGNOSIS — E782 Mixed hyperlipidemia: Secondary | ICD-10-CM | POA: Diagnosis not present

## 2016-02-27 DIAGNOSIS — L814 Other melanin hyperpigmentation: Secondary | ICD-10-CM | POA: Diagnosis not present

## 2016-02-27 DIAGNOSIS — I10 Essential (primary) hypertension: Secondary | ICD-10-CM | POA: Diagnosis not present

## 2016-02-27 DIAGNOSIS — L02232 Carbuncle of back [any part, except buttock]: Secondary | ICD-10-CM | POA: Diagnosis not present

## 2016-02-29 DIAGNOSIS — I10 Essential (primary) hypertension: Secondary | ICD-10-CM | POA: Diagnosis not present

## 2016-02-29 DIAGNOSIS — H353221 Exudative age-related macular degeneration, left eye, with active choroidal neovascularization: Secondary | ICD-10-CM | POA: Diagnosis not present

## 2016-04-11 DIAGNOSIS — L810 Postinflammatory hyperpigmentation: Secondary | ICD-10-CM | POA: Diagnosis not present

## 2016-04-11 DIAGNOSIS — L309 Dermatitis, unspecified: Secondary | ICD-10-CM | POA: Diagnosis not present

## 2016-04-11 DIAGNOSIS — L720 Epidermal cyst: Secondary | ICD-10-CM | POA: Diagnosis not present

## 2016-04-11 DIAGNOSIS — L308 Other specified dermatitis: Secondary | ICD-10-CM | POA: Diagnosis not present

## 2016-04-12 DIAGNOSIS — Z23 Encounter for immunization: Secondary | ICD-10-CM | POA: Diagnosis not present

## 2016-05-29 DIAGNOSIS — H353221 Exudative age-related macular degeneration, left eye, with active choroidal neovascularization: Secondary | ICD-10-CM | POA: Diagnosis not present

## 2016-05-29 DIAGNOSIS — H353212 Exudative age-related macular degeneration, right eye, with inactive choroidal neovascularization: Secondary | ICD-10-CM | POA: Diagnosis not present

## 2016-05-29 DIAGNOSIS — H353123 Nonexudative age-related macular degeneration, left eye, advanced atrophic without subfoveal involvement: Secondary | ICD-10-CM | POA: Diagnosis not present

## 2016-05-29 DIAGNOSIS — H353114 Nonexudative age-related macular degeneration, right eye, advanced atrophic with subfoveal involvement: Secondary | ICD-10-CM | POA: Diagnosis not present

## 2016-07-30 DIAGNOSIS — I10 Essential (primary) hypertension: Secondary | ICD-10-CM | POA: Diagnosis not present

## 2016-07-30 DIAGNOSIS — E118 Type 2 diabetes mellitus with unspecified complications: Secondary | ICD-10-CM | POA: Diagnosis not present

## 2016-07-30 DIAGNOSIS — R799 Abnormal finding of blood chemistry, unspecified: Secondary | ICD-10-CM | POA: Diagnosis not present

## 2016-07-30 DIAGNOSIS — D6489 Other specified anemias: Secondary | ICD-10-CM | POA: Diagnosis not present

## 2016-07-30 DIAGNOSIS — E782 Mixed hyperlipidemia: Secondary | ICD-10-CM | POA: Diagnosis not present

## 2016-07-30 DIAGNOSIS — Z6833 Body mass index (BMI) 33.0-33.9, adult: Secondary | ICD-10-CM | POA: Diagnosis not present

## 2016-08-22 DIAGNOSIS — H353221 Exudative age-related macular degeneration, left eye, with active choroidal neovascularization: Secondary | ICD-10-CM | POA: Diagnosis not present

## 2016-09-26 DIAGNOSIS — Z0000 Encounter for general adult medical examination without abnormal findings: Secondary | ICD-10-CM | POA: Diagnosis not present

## 2016-11-11 ENCOUNTER — Other Ambulatory Visit: Payer: Self-pay | Admitting: Family Medicine

## 2016-11-11 DIAGNOSIS — Z1231 Encounter for screening mammogram for malignant neoplasm of breast: Secondary | ICD-10-CM

## 2016-11-27 DIAGNOSIS — H43812 Vitreous degeneration, left eye: Secondary | ICD-10-CM | POA: Diagnosis not present

## 2016-11-27 DIAGNOSIS — H353212 Exudative age-related macular degeneration, right eye, with inactive choroidal neovascularization: Secondary | ICD-10-CM | POA: Diagnosis not present

## 2016-11-27 DIAGNOSIS — H353123 Nonexudative age-related macular degeneration, left eye, advanced atrophic without subfoveal involvement: Secondary | ICD-10-CM | POA: Diagnosis not present

## 2016-11-27 DIAGNOSIS — H353222 Exudative age-related macular degeneration, left eye, with inactive choroidal neovascularization: Secondary | ICD-10-CM | POA: Diagnosis not present

## 2016-11-27 DIAGNOSIS — H353114 Nonexudative age-related macular degeneration, right eye, advanced atrophic with subfoveal involvement: Secondary | ICD-10-CM | POA: Diagnosis not present

## 2016-11-28 DIAGNOSIS — I10 Essential (primary) hypertension: Secondary | ICD-10-CM | POA: Diagnosis not present

## 2016-11-28 DIAGNOSIS — R4189 Other symptoms and signs involving cognitive functions and awareness: Secondary | ICD-10-CM | POA: Diagnosis not present

## 2016-11-28 DIAGNOSIS — E118 Type 2 diabetes mellitus with unspecified complications: Secondary | ICD-10-CM | POA: Diagnosis not present

## 2016-12-09 ENCOUNTER — Ambulatory Visit
Admission: RE | Admit: 2016-12-09 | Discharge: 2016-12-09 | Disposition: A | Discharge status: Home / Self Care | Payer: Medicare Other | Source: Ambulatory Visit | Attending: Family Medicine | Admitting: Family Medicine

## 2016-12-09 DIAGNOSIS — Z1231 Encounter for screening mammogram for malignant neoplasm of breast: Secondary | ICD-10-CM

## 2017-02-12 DIAGNOSIS — H353114 Nonexudative age-related macular degeneration, right eye, advanced atrophic with subfoveal involvement: Secondary | ICD-10-CM | POA: Diagnosis not present

## 2017-02-12 DIAGNOSIS — H43812 Vitreous degeneration, left eye: Secondary | ICD-10-CM | POA: Diagnosis not present

## 2017-02-12 DIAGNOSIS — H353123 Nonexudative age-related macular degeneration, left eye, advanced atrophic without subfoveal involvement: Secondary | ICD-10-CM | POA: Diagnosis not present

## 2017-02-12 DIAGNOSIS — H353222 Exudative age-related macular degeneration, left eye, with inactive choroidal neovascularization: Secondary | ICD-10-CM | POA: Diagnosis not present

## 2017-02-12 DIAGNOSIS — H353212 Exudative age-related macular degeneration, right eye, with inactive choroidal neovascularization: Secondary | ICD-10-CM | POA: Diagnosis not present

## 2017-04-01 DIAGNOSIS — I10 Essential (primary) hypertension: Secondary | ICD-10-CM | POA: Diagnosis not present

## 2017-04-01 DIAGNOSIS — E782 Mixed hyperlipidemia: Secondary | ICD-10-CM | POA: Diagnosis not present

## 2017-04-01 DIAGNOSIS — Z6832 Body mass index (BMI) 32.0-32.9, adult: Secondary | ICD-10-CM | POA: Diagnosis not present

## 2017-04-01 DIAGNOSIS — E118 Type 2 diabetes mellitus with unspecified complications: Secondary | ICD-10-CM | POA: Diagnosis not present

## 2017-04-01 DIAGNOSIS — Z23 Encounter for immunization: Secondary | ICD-10-CM | POA: Diagnosis not present

## 2017-06-05 DIAGNOSIS — H353222 Exudative age-related macular degeneration, left eye, with inactive choroidal neovascularization: Secondary | ICD-10-CM | POA: Diagnosis not present

## 2017-06-05 DIAGNOSIS — H353123 Nonexudative age-related macular degeneration, left eye, advanced atrophic without subfoveal involvement: Secondary | ICD-10-CM | POA: Diagnosis not present

## 2017-06-05 DIAGNOSIS — H353212 Exudative age-related macular degeneration, right eye, with inactive choroidal neovascularization: Secondary | ICD-10-CM | POA: Diagnosis not present

## 2017-06-05 DIAGNOSIS — H43812 Vitreous degeneration, left eye: Secondary | ICD-10-CM | POA: Diagnosis not present

## 2017-07-29 DIAGNOSIS — E118 Type 2 diabetes mellitus with unspecified complications: Secondary | ICD-10-CM | POA: Diagnosis not present

## 2017-07-29 DIAGNOSIS — E782 Mixed hyperlipidemia: Secondary | ICD-10-CM | POA: Diagnosis not present

## 2017-07-29 DIAGNOSIS — I10 Essential (primary) hypertension: Secondary | ICD-10-CM | POA: Diagnosis not present

## 2017-07-29 DIAGNOSIS — E089 Diabetes mellitus due to underlying condition without complications: Secondary | ICD-10-CM | POA: Diagnosis not present

## 2017-09-04 DIAGNOSIS — H353212 Exudative age-related macular degeneration, right eye, with inactive choroidal neovascularization: Secondary | ICD-10-CM | POA: Diagnosis not present

## 2017-09-04 DIAGNOSIS — H43812 Vitreous degeneration, left eye: Secondary | ICD-10-CM | POA: Diagnosis not present

## 2017-09-04 DIAGNOSIS — H353222 Exudative age-related macular degeneration, left eye, with inactive choroidal neovascularization: Secondary | ICD-10-CM | POA: Diagnosis not present

## 2017-09-04 DIAGNOSIS — H353123 Nonexudative age-related macular degeneration, left eye, advanced atrophic without subfoveal involvement: Secondary | ICD-10-CM | POA: Diagnosis not present

## 2017-09-30 DIAGNOSIS — I10 Essential (primary) hypertension: Secondary | ICD-10-CM | POA: Diagnosis not present

## 2017-09-30 DIAGNOSIS — E118 Type 2 diabetes mellitus with unspecified complications: Secondary | ICD-10-CM | POA: Diagnosis not present

## 2017-09-30 DIAGNOSIS — E782 Mixed hyperlipidemia: Secondary | ICD-10-CM | POA: Diagnosis not present

## 2018-01-14 DIAGNOSIS — H353123 Nonexudative age-related macular degeneration, left eye, advanced atrophic without subfoveal involvement: Secondary | ICD-10-CM | POA: Diagnosis not present

## 2018-01-14 DIAGNOSIS — H353221 Exudative age-related macular degeneration, left eye, with active choroidal neovascularization: Secondary | ICD-10-CM | POA: Diagnosis not present

## 2018-01-14 DIAGNOSIS — H353114 Nonexudative age-related macular degeneration, right eye, advanced atrophic with subfoveal involvement: Secondary | ICD-10-CM | POA: Diagnosis not present

## 2018-01-14 DIAGNOSIS — H353212 Exudative age-related macular degeneration, right eye, with inactive choroidal neovascularization: Secondary | ICD-10-CM | POA: Diagnosis not present

## 2018-01-14 DIAGNOSIS — H43812 Vitreous degeneration, left eye: Secondary | ICD-10-CM | POA: Diagnosis not present

## 2018-02-03 DIAGNOSIS — E118 Type 2 diabetes mellitus with unspecified complications: Secondary | ICD-10-CM | POA: Diagnosis not present

## 2018-02-03 DIAGNOSIS — J399 Disease of upper respiratory tract, unspecified: Secondary | ICD-10-CM | POA: Diagnosis not present

## 2018-02-03 DIAGNOSIS — E782 Mixed hyperlipidemia: Secondary | ICD-10-CM | POA: Diagnosis not present

## 2018-02-03 DIAGNOSIS — F4329 Adjustment disorder with other symptoms: Secondary | ICD-10-CM | POA: Diagnosis not present

## 2018-02-03 DIAGNOSIS — I10 Essential (primary) hypertension: Secondary | ICD-10-CM | POA: Diagnosis not present

## 2018-02-03 DIAGNOSIS — Z6832 Body mass index (BMI) 32.0-32.9, adult: Secondary | ICD-10-CM | POA: Diagnosis not present

## 2018-03-10 DIAGNOSIS — H353221 Exudative age-related macular degeneration, left eye, with active choroidal neovascularization: Secondary | ICD-10-CM | POA: Diagnosis not present

## 2018-03-10 DIAGNOSIS — H43812 Vitreous degeneration, left eye: Secondary | ICD-10-CM | POA: Diagnosis not present

## 2018-03-10 DIAGNOSIS — H353123 Nonexudative age-related macular degeneration, left eye, advanced atrophic without subfoveal involvement: Secondary | ICD-10-CM | POA: Diagnosis not present

## 2018-03-10 DIAGNOSIS — H353212 Exudative age-related macular degeneration, right eye, with inactive choroidal neovascularization: Secondary | ICD-10-CM | POA: Diagnosis not present

## 2018-03-10 DIAGNOSIS — H353114 Nonexudative age-related macular degeneration, right eye, advanced atrophic with subfoveal involvement: Secondary | ICD-10-CM | POA: Diagnosis not present

## 2018-03-31 DIAGNOSIS — J399 Disease of upper respiratory tract, unspecified: Secondary | ICD-10-CM | POA: Diagnosis not present

## 2018-03-31 DIAGNOSIS — E785 Hyperlipidemia, unspecified: Secondary | ICD-10-CM | POA: Diagnosis not present

## 2018-03-31 DIAGNOSIS — I10 Essential (primary) hypertension: Secondary | ICD-10-CM | POA: Diagnosis not present

## 2018-03-31 DIAGNOSIS — E118 Type 2 diabetes mellitus with unspecified complications: Secondary | ICD-10-CM | POA: Diagnosis not present

## 2018-03-31 DIAGNOSIS — Z6832 Body mass index (BMI) 32.0-32.9, adult: Secondary | ICD-10-CM | POA: Diagnosis not present

## 2018-04-28 ENCOUNTER — Other Ambulatory Visit: Payer: Self-pay

## 2018-05-12 DIAGNOSIS — H43812 Vitreous degeneration, left eye: Secondary | ICD-10-CM | POA: Diagnosis not present

## 2018-05-12 DIAGNOSIS — H3562 Retinal hemorrhage, left eye: Secondary | ICD-10-CM | POA: Diagnosis not present

## 2018-05-12 DIAGNOSIS — H353221 Exudative age-related macular degeneration, left eye, with active choroidal neovascularization: Secondary | ICD-10-CM | POA: Diagnosis not present

## 2018-07-01 DIAGNOSIS — E785 Hyperlipidemia, unspecified: Secondary | ICD-10-CM | POA: Diagnosis not present

## 2018-07-01 DIAGNOSIS — I10 Essential (primary) hypertension: Secondary | ICD-10-CM | POA: Diagnosis not present

## 2018-07-01 DIAGNOSIS — E118 Type 2 diabetes mellitus with unspecified complications: Secondary | ICD-10-CM | POA: Diagnosis not present

## 2018-07-01 DIAGNOSIS — F99 Mental disorder, not otherwise specified: Secondary | ICD-10-CM | POA: Diagnosis not present

## 2018-07-01 DIAGNOSIS — D529 Folate deficiency anemia, unspecified: Secondary | ICD-10-CM | POA: Diagnosis not present

## 2018-07-16 DIAGNOSIS — H3562 Retinal hemorrhage, left eye: Secondary | ICD-10-CM | POA: Diagnosis not present

## 2018-07-16 DIAGNOSIS — H43812 Vitreous degeneration, left eye: Secondary | ICD-10-CM | POA: Diagnosis not present

## 2018-07-16 DIAGNOSIS — H353221 Exudative age-related macular degeneration, left eye, with active choroidal neovascularization: Secondary | ICD-10-CM | POA: Diagnosis not present

## 2018-07-23 DIAGNOSIS — Z0000 Encounter for general adult medical examination without abnormal findings: Secondary | ICD-10-CM | POA: Diagnosis not present

## 2018-12-07 DIAGNOSIS — E118 Type 2 diabetes mellitus with unspecified complications: Secondary | ICD-10-CM | POA: Diagnosis not present

## 2018-12-07 DIAGNOSIS — I10 Essential (primary) hypertension: Secondary | ICD-10-CM | POA: Diagnosis not present

## 2018-12-07 DIAGNOSIS — D649 Anemia, unspecified: Secondary | ICD-10-CM | POA: Diagnosis not present

## 2018-12-07 DIAGNOSIS — E785 Hyperlipidemia, unspecified: Secondary | ICD-10-CM | POA: Diagnosis not present

## 2018-12-07 DIAGNOSIS — D529 Folate deficiency anemia, unspecified: Secondary | ICD-10-CM | POA: Diagnosis not present

## 2018-12-08 DIAGNOSIS — E785 Hyperlipidemia, unspecified: Secondary | ICD-10-CM | POA: Diagnosis not present

## 2018-12-08 DIAGNOSIS — I10 Essential (primary) hypertension: Secondary | ICD-10-CM | POA: Diagnosis not present

## 2018-12-08 DIAGNOSIS — D6489 Other specified anemias: Secondary | ICD-10-CM | POA: Diagnosis not present

## 2019-04-15 DIAGNOSIS — E1169 Type 2 diabetes mellitus with other specified complication: Secondary | ICD-10-CM | POA: Diagnosis not present

## 2019-04-15 DIAGNOSIS — Z23 Encounter for immunization: Secondary | ICD-10-CM | POA: Diagnosis not present

## 2019-04-15 DIAGNOSIS — E785 Hyperlipidemia, unspecified: Secondary | ICD-10-CM | POA: Diagnosis not present

## 2019-04-15 DIAGNOSIS — I10 Essential (primary) hypertension: Secondary | ICD-10-CM | POA: Diagnosis not present

## 2019-08-12 ENCOUNTER — Ambulatory Visit: Payer: Medicare Other | Attending: Internal Medicine

## 2019-08-12 DIAGNOSIS — Z23 Encounter for immunization: Secondary | ICD-10-CM

## 2019-08-12 NOTE — Progress Notes (Signed)
   Covid-19 Vaccination Clinic  Name:  Denise Wilson    MRN: [id number] DOB: 11-30-1940  08/12/2019  [name] was observed post Covid-19 immunization for 15 minutes without incident. She was provided with Vaccine Information Sheet and instruction to access the V-Safe system.   [name] was instructed to call 911 with any severe reactions post vaccine: Marland Kitchen Difficulty breathing  . Swelling of face and throat  . A fast heartbeat  . A bad rash all over body  . Dizziness and weakness   Immunizations Administered    Name Date Dose VIS Date Route   Pfizer COVID-19 Vaccine 08/12/2019  2:07 PM 0.3 mL 05/21/2019 Intramuscular   Manufacturer: [place]   Lot: [id number]   [place]: [id number]

## 2019-08-24 DIAGNOSIS — E782 Mixed hyperlipidemia: Secondary | ICD-10-CM | POA: Diagnosis not present

## 2019-08-24 DIAGNOSIS — Z6832 Body mass index (BMI) 32.0-32.9, adult: Secondary | ICD-10-CM | POA: Diagnosis not present

## 2019-08-24 DIAGNOSIS — E559 Vitamin D deficiency, unspecified: Secondary | ICD-10-CM | POA: Diagnosis not present

## 2019-08-24 DIAGNOSIS — I10 Essential (primary) hypertension: Secondary | ICD-10-CM | POA: Diagnosis not present

## 2019-08-24 DIAGNOSIS — E1169 Type 2 diabetes mellitus with other specified complication: Secondary | ICD-10-CM | POA: Diagnosis not present

## 2019-09-08 ENCOUNTER — Ambulatory Visit: Payer: Medicare Other | Attending: Internal Medicine

## 2019-09-08 DIAGNOSIS — Z23 Encounter for immunization: Secondary | ICD-10-CM

## 2019-09-08 NOTE — Progress Notes (Signed)
   Covid-19 Vaccination Clinic  Name:  Denise Wilson    MRN: [id number] DOB: 11/24/1940  09/08/2019  [name] was observed post Covid-19 immunization for 15 minutes without incident. She was provided with Vaccine Information Sheet and instruction to access the V-Safe system.   [name] was instructed to call 911 with any severe reactions post vaccine: Marland Kitchen Difficulty breathing  . Swelling of face and throat  . A fast heartbeat  . A bad rash all over body  . Dizziness and weakness   Immunizations Administered    Name Date Dose VIS Date Route   Pfizer COVID-19 Vaccine 09/08/2019  1:11 PM 0.3 mL 05/21/2019 Intramuscular   Manufacturer: [place]   Lot: [id number]   [place]: [id number]

## 2019-10-20 DIAGNOSIS — D649 Anemia, unspecified: Secondary | ICD-10-CM | POA: Diagnosis not present

## 2019-10-20 DIAGNOSIS — Z6831 Body mass index (BMI) 31.0-31.9, adult: Secondary | ICD-10-CM | POA: Diagnosis not present

## 2020-01-07 DIAGNOSIS — D6489 Other specified anemias: Secondary | ICD-10-CM | POA: Diagnosis not present

## 2020-01-07 DIAGNOSIS — I10 Essential (primary) hypertension: Secondary | ICD-10-CM | POA: Diagnosis not present

## 2020-01-07 DIAGNOSIS — E7849 Other hyperlipidemia: Secondary | ICD-10-CM | POA: Diagnosis not present

## 2020-01-07 DIAGNOSIS — E1169 Type 2 diabetes mellitus with other specified complication: Secondary | ICD-10-CM | POA: Diagnosis not present

## 2020-01-27 ENCOUNTER — Other Ambulatory Visit: Payer: Self-pay | Admitting: Family Medicine

## 2020-01-27 DIAGNOSIS — D649 Anemia, unspecified: Secondary | ICD-10-CM | POA: Diagnosis not present

## 2020-01-27 DIAGNOSIS — E785 Hyperlipidemia, unspecified: Secondary | ICD-10-CM | POA: Diagnosis not present

## 2020-01-27 DIAGNOSIS — R109 Unspecified abdominal pain: Secondary | ICD-10-CM

## 2020-01-27 DIAGNOSIS — Z6832 Body mass index (BMI) 32.0-32.9, adult: Secondary | ICD-10-CM | POA: Diagnosis not present

## 2020-01-27 DIAGNOSIS — I10 Essential (primary) hypertension: Secondary | ICD-10-CM | POA: Diagnosis not present

## 2020-01-27 DIAGNOSIS — R799 Abnormal finding of blood chemistry, unspecified: Secondary | ICD-10-CM | POA: Diagnosis not present

## 2020-01-27 DIAGNOSIS — E1169 Type 2 diabetes mellitus with other specified complication: Secondary | ICD-10-CM | POA: Diagnosis not present

## 2020-02-09 ENCOUNTER — Other Ambulatory Visit: Payer: Medicare Other

## 2020-05-09 DIAGNOSIS — I10 Essential (primary) hypertension: Secondary | ICD-10-CM | POA: Diagnosis not present

## 2020-05-09 DIAGNOSIS — E7849 Other hyperlipidemia: Secondary | ICD-10-CM | POA: Diagnosis not present

## 2020-05-09 DIAGNOSIS — D6489 Other specified anemias: Secondary | ICD-10-CM | POA: Diagnosis not present

## 2020-05-09 DIAGNOSIS — E1169 Type 2 diabetes mellitus with other specified complication: Secondary | ICD-10-CM | POA: Diagnosis not present

## 2020-08-09 DIAGNOSIS — D6489 Other specified anemias: Secondary | ICD-10-CM | POA: Diagnosis not present

## 2020-08-09 DIAGNOSIS — E1169 Type 2 diabetes mellitus with other specified complication: Secondary | ICD-10-CM | POA: Diagnosis not present

## 2020-08-09 DIAGNOSIS — F99 Mental disorder, not otherwise specified: Secondary | ICD-10-CM | POA: Diagnosis not present

## 2020-08-09 DIAGNOSIS — I10 Essential (primary) hypertension: Secondary | ICD-10-CM | POA: Diagnosis not present

## 2020-08-09 DIAGNOSIS — J441 Chronic obstructive pulmonary disease with (acute) exacerbation: Secondary | ICD-10-CM | POA: Diagnosis not present

## 2020-08-09 DIAGNOSIS — E782 Mixed hyperlipidemia: Secondary | ICD-10-CM | POA: Diagnosis not present

## 2020-08-21 DIAGNOSIS — I10 Essential (primary) hypertension: Secondary | ICD-10-CM | POA: Diagnosis not present

## 2020-08-21 DIAGNOSIS — E1169 Type 2 diabetes mellitus with other specified complication: Secondary | ICD-10-CM | POA: Diagnosis not present

## 2020-09-07 DIAGNOSIS — I10 Essential (primary) hypertension: Secondary | ICD-10-CM | POA: Diagnosis not present

## 2020-09-07 DIAGNOSIS — E1169 Type 2 diabetes mellitus with other specified complication: Secondary | ICD-10-CM | POA: Diagnosis not present

## 2020-09-07 DIAGNOSIS — E7849 Other hyperlipidemia: Secondary | ICD-10-CM | POA: Diagnosis not present

## 2020-09-08 DIAGNOSIS — E1122 Type 2 diabetes mellitus with diabetic chronic kidney disease: Secondary | ICD-10-CM | POA: Diagnosis not present

## 2020-09-08 DIAGNOSIS — F99 Mental disorder, not otherwise specified: Secondary | ICD-10-CM | POA: Diagnosis not present

## 2020-09-08 DIAGNOSIS — D631 Anemia in chronic kidney disease: Secondary | ICD-10-CM | POA: Diagnosis not present

## 2020-09-08 DIAGNOSIS — I129 Hypertensive chronic kidney disease with stage 1 through stage 4 chronic kidney disease, or unspecified chronic kidney disease: Secondary | ICD-10-CM | POA: Diagnosis not present

## 2020-09-08 DIAGNOSIS — N1831 Chronic kidney disease, stage 3a: Secondary | ICD-10-CM | POA: Diagnosis not present

## 2020-09-08 DIAGNOSIS — E1169 Type 2 diabetes mellitus with other specified complication: Secondary | ICD-10-CM | POA: Diagnosis not present

## 2020-09-08 DIAGNOSIS — E782 Mixed hyperlipidemia: Secondary | ICD-10-CM | POA: Diagnosis not present

## 2020-09-08 DIAGNOSIS — J399 Disease of upper respiratory tract, unspecified: Secondary | ICD-10-CM | POA: Diagnosis not present

## 2020-10-19 DIAGNOSIS — E1169 Type 2 diabetes mellitus with other specified complication: Secondary | ICD-10-CM | POA: Diagnosis not present

## 2020-10-19 DIAGNOSIS — I10 Essential (primary) hypertension: Secondary | ICD-10-CM | POA: Diagnosis not present

## 2020-10-19 DIAGNOSIS — E7849 Other hyperlipidemia: Secondary | ICD-10-CM | POA: Diagnosis not present

## 2020-10-19 DIAGNOSIS — R4189 Other symptoms and signs involving cognitive functions and awareness: Secondary | ICD-10-CM | POA: Diagnosis not present

## 2020-10-19 DIAGNOSIS — Z0000 Encounter for general adult medical examination without abnormal findings: Secondary | ICD-10-CM | POA: Diagnosis not present

## 2020-10-19 DIAGNOSIS — E785 Hyperlipidemia, unspecified: Secondary | ICD-10-CM | POA: Diagnosis not present

## 2020-11-30 DIAGNOSIS — E785 Hyperlipidemia, unspecified: Secondary | ICD-10-CM | POA: Diagnosis not present

## 2020-11-30 DIAGNOSIS — E1122 Type 2 diabetes mellitus with diabetic chronic kidney disease: Secondary | ICD-10-CM | POA: Diagnosis not present

## 2020-11-30 DIAGNOSIS — D558 Other anemias due to enzyme disorders: Secondary | ICD-10-CM | POA: Diagnosis not present

## 2020-11-30 DIAGNOSIS — N1831 Chronic kidney disease, stage 3a: Secondary | ICD-10-CM | POA: Diagnosis not present

## 2020-11-30 DIAGNOSIS — F99 Mental disorder, not otherwise specified: Secondary | ICD-10-CM | POA: Diagnosis not present

## 2020-11-30 DIAGNOSIS — I129 Hypertensive chronic kidney disease with stage 1 through stage 4 chronic kidney disease, or unspecified chronic kidney disease: Secondary | ICD-10-CM | POA: Diagnosis not present

## 2021-01-07 DIAGNOSIS — I10 Essential (primary) hypertension: Secondary | ICD-10-CM | POA: Diagnosis not present

## 2021-01-07 DIAGNOSIS — E1169 Type 2 diabetes mellitus with other specified complication: Secondary | ICD-10-CM | POA: Diagnosis not present

## 2021-01-07 DIAGNOSIS — E7849 Other hyperlipidemia: Secondary | ICD-10-CM | POA: Diagnosis not present

## 2021-02-07 DIAGNOSIS — E1169 Type 2 diabetes mellitus with other specified complication: Secondary | ICD-10-CM | POA: Diagnosis not present

## 2021-02-07 DIAGNOSIS — E7849 Other hyperlipidemia: Secondary | ICD-10-CM | POA: Diagnosis not present

## 2021-02-07 DIAGNOSIS — I10 Essential (primary) hypertension: Secondary | ICD-10-CM | POA: Diagnosis not present

## 2021-03-01 DIAGNOSIS — I10 Essential (primary) hypertension: Secondary | ICD-10-CM | POA: Diagnosis not present

## 2021-03-01 DIAGNOSIS — N1831 Chronic kidney disease, stage 3a: Secondary | ICD-10-CM | POA: Diagnosis not present

## 2021-03-01 DIAGNOSIS — E785 Hyperlipidemia, unspecified: Secondary | ICD-10-CM | POA: Diagnosis not present

## 2021-03-01 DIAGNOSIS — E782 Mixed hyperlipidemia: Secondary | ICD-10-CM | POA: Diagnosis not present

## 2021-03-01 DIAGNOSIS — D649 Anemia, unspecified: Secondary | ICD-10-CM | POA: Diagnosis not present

## 2021-03-01 DIAGNOSIS — E1169 Type 2 diabetes mellitus with other specified complication: Secondary | ICD-10-CM | POA: Diagnosis not present

## 2021-04-09 DIAGNOSIS — D6489 Other specified anemias: Secondary | ICD-10-CM | POA: Diagnosis not present

## 2021-04-09 DIAGNOSIS — E7849 Other hyperlipidemia: Secondary | ICD-10-CM | POA: Diagnosis not present

## 2021-04-09 DIAGNOSIS — I10 Essential (primary) hypertension: Secondary | ICD-10-CM | POA: Diagnosis not present

## 2021-04-09 DIAGNOSIS — E1169 Type 2 diabetes mellitus with other specified complication: Secondary | ICD-10-CM | POA: Diagnosis not present

## 2021-05-24 DIAGNOSIS — J441 Chronic obstructive pulmonary disease with (acute) exacerbation: Secondary | ICD-10-CM | POA: Diagnosis not present

## 2021-05-24 DIAGNOSIS — E785 Hyperlipidemia, unspecified: Secondary | ICD-10-CM | POA: Diagnosis not present

## 2021-05-24 DIAGNOSIS — Z6829 Body mass index (BMI) 29.0-29.9, adult: Secondary | ICD-10-CM | POA: Diagnosis not present

## 2021-05-24 DIAGNOSIS — E1169 Type 2 diabetes mellitus with other specified complication: Secondary | ICD-10-CM | POA: Diagnosis not present

## 2021-05-24 DIAGNOSIS — I10 Essential (primary) hypertension: Secondary | ICD-10-CM | POA: Diagnosis not present

## 2021-10-05 DIAGNOSIS — J441 Chronic obstructive pulmonary disease with (acute) exacerbation: Secondary | ICD-10-CM | POA: Diagnosis not present

## 2021-10-05 DIAGNOSIS — E785 Hyperlipidemia, unspecified: Secondary | ICD-10-CM | POA: Diagnosis not present

## 2021-10-05 DIAGNOSIS — E1169 Type 2 diabetes mellitus with other specified complication: Secondary | ICD-10-CM | POA: Diagnosis not present

## 2021-10-05 DIAGNOSIS — I10 Essential (primary) hypertension: Secondary | ICD-10-CM | POA: Diagnosis not present

## 2021-10-07 DIAGNOSIS — E1169 Type 2 diabetes mellitus with other specified complication: Secondary | ICD-10-CM | POA: Diagnosis not present

## 2021-10-07 DIAGNOSIS — I10 Essential (primary) hypertension: Secondary | ICD-10-CM | POA: Diagnosis not present

## 2021-11-07 DIAGNOSIS — Z0000 Encounter for general adult medical examination without abnormal findings: Secondary | ICD-10-CM | POA: Diagnosis not present

## 2021-11-07 DIAGNOSIS — E1169 Type 2 diabetes mellitus with other specified complication: Secondary | ICD-10-CM | POA: Diagnosis not present

## 2021-11-07 DIAGNOSIS — I10 Essential (primary) hypertension: Secondary | ICD-10-CM | POA: Diagnosis not present

## 2021-11-07 DIAGNOSIS — J441 Chronic obstructive pulmonary disease with (acute) exacerbation: Secondary | ICD-10-CM | POA: Diagnosis not present

## 2021-11-07 DIAGNOSIS — E785 Hyperlipidemia, unspecified: Secondary | ICD-10-CM | POA: Diagnosis not present

## 2021-11-07 DIAGNOSIS — Z6829 Body mass index (BMI) 29.0-29.9, adult: Secondary | ICD-10-CM | POA: Diagnosis not present

## 2021-12-07 DIAGNOSIS — I10 Essential (primary) hypertension: Secondary | ICD-10-CM | POA: Diagnosis not present

## 2021-12-07 DIAGNOSIS — E1169 Type 2 diabetes mellitus with other specified complication: Secondary | ICD-10-CM | POA: Diagnosis not present

## 2022-01-07 DIAGNOSIS — I10 Essential (primary) hypertension: Secondary | ICD-10-CM | POA: Diagnosis not present

## 2022-01-07 DIAGNOSIS — E7849 Other hyperlipidemia: Secondary | ICD-10-CM | POA: Diagnosis not present

## 2022-01-07 DIAGNOSIS — E1169 Type 2 diabetes mellitus with other specified complication: Secondary | ICD-10-CM | POA: Diagnosis not present

## 2022-01-23 DIAGNOSIS — I10 Essential (primary) hypertension: Secondary | ICD-10-CM | POA: Diagnosis not present

## 2022-01-23 DIAGNOSIS — F4329 Adjustment disorder with other symptoms: Secondary | ICD-10-CM | POA: Diagnosis not present

## 2022-01-23 DIAGNOSIS — E7849 Other hyperlipidemia: Secondary | ICD-10-CM | POA: Diagnosis not present

## 2022-01-23 DIAGNOSIS — F99 Mental disorder, not otherwise specified: Secondary | ICD-10-CM | POA: Diagnosis not present

## 2022-01-23 DIAGNOSIS — E1169 Type 2 diabetes mellitus with other specified complication: Secondary | ICD-10-CM | POA: Diagnosis not present

## 2022-03-06 DIAGNOSIS — E1169 Type 2 diabetes mellitus with other specified complication: Secondary | ICD-10-CM | POA: Diagnosis not present

## 2022-03-06 DIAGNOSIS — R4189 Other symptoms and signs involving cognitive functions and awareness: Secondary | ICD-10-CM | POA: Diagnosis not present

## 2022-03-06 DIAGNOSIS — I10 Essential (primary) hypertension: Secondary | ICD-10-CM | POA: Diagnosis not present

## 2022-03-09 DIAGNOSIS — E1169 Type 2 diabetes mellitus with other specified complication: Secondary | ICD-10-CM | POA: Diagnosis not present

## 2022-03-09 DIAGNOSIS — I10 Essential (primary) hypertension: Secondary | ICD-10-CM | POA: Diagnosis not present

## 2022-07-10 DIAGNOSIS — N1832 Chronic kidney disease, stage 3b: Secondary | ICD-10-CM | POA: Diagnosis not present

## 2022-07-10 DIAGNOSIS — E785 Hyperlipidemia, unspecified: Secondary | ICD-10-CM | POA: Diagnosis not present

## 2022-07-10 DIAGNOSIS — I129 Hypertensive chronic kidney disease with stage 1 through stage 4 chronic kidney disease, or unspecified chronic kidney disease: Secondary | ICD-10-CM | POA: Diagnosis not present

## 2022-08-08 DIAGNOSIS — E7849 Other hyperlipidemia: Secondary | ICD-10-CM | POA: Diagnosis not present

## 2022-08-08 DIAGNOSIS — I10 Essential (primary) hypertension: Secondary | ICD-10-CM | POA: Diagnosis not present

## 2022-08-08 DIAGNOSIS — E1169 Type 2 diabetes mellitus with other specified complication: Secondary | ICD-10-CM | POA: Diagnosis not present

## 2022-10-02 DIAGNOSIS — I10 Essential (primary) hypertension: Secondary | ICD-10-CM | POA: Diagnosis not present

## 2022-10-02 DIAGNOSIS — E1169 Type 2 diabetes mellitus with other specified complication: Secondary | ICD-10-CM | POA: Diagnosis not present

## 2022-10-02 DIAGNOSIS — E785 Hyperlipidemia, unspecified: Secondary | ICD-10-CM | POA: Diagnosis not present

## 2022-10-15 DIAGNOSIS — R413 Other amnesia: Secondary | ICD-10-CM | POA: Diagnosis not present

## 2022-10-15 DIAGNOSIS — F4329 Adjustment disorder with other symptoms: Secondary | ICD-10-CM | POA: Diagnosis not present

## 2022-10-15 DIAGNOSIS — N1831 Chronic kidney disease, stage 3a: Secondary | ICD-10-CM | POA: Diagnosis not present

## 2022-10-15 DIAGNOSIS — I10 Essential (primary) hypertension: Secondary | ICD-10-CM | POA: Diagnosis not present

## 2022-10-15 DIAGNOSIS — J441 Chronic obstructive pulmonary disease with (acute) exacerbation: Secondary | ICD-10-CM | POA: Diagnosis not present

## 2022-10-15 DIAGNOSIS — D649 Anemia, unspecified: Secondary | ICD-10-CM | POA: Diagnosis not present

## 2022-10-15 DIAGNOSIS — E782 Mixed hyperlipidemia: Secondary | ICD-10-CM | POA: Diagnosis not present

## 2023-03-07 DIAGNOSIS — E1169 Type 2 diabetes mellitus with other specified complication: Secondary | ICD-10-CM | POA: Diagnosis not present

## 2023-03-07 DIAGNOSIS — I10 Essential (primary) hypertension: Secondary | ICD-10-CM | POA: Diagnosis not present

## 2023-03-07 DIAGNOSIS — F99 Mental disorder, not otherwise specified: Secondary | ICD-10-CM | POA: Diagnosis not present

## 2023-03-07 DIAGNOSIS — E782 Mixed hyperlipidemia: Secondary | ICD-10-CM | POA: Diagnosis not present

## 2023-03-07 DIAGNOSIS — N1832 Chronic kidney disease, stage 3b: Secondary | ICD-10-CM | POA: Diagnosis not present

## 2023-03-07 DIAGNOSIS — Z6829 Body mass index (BMI) 29.0-29.9, adult: Secondary | ICD-10-CM | POA: Diagnosis not present

## 2023-03-07 DIAGNOSIS — I129 Hypertensive chronic kidney disease with stage 1 through stage 4 chronic kidney disease, or unspecified chronic kidney disease: Secondary | ICD-10-CM | POA: Diagnosis not present

## 2023-03-07 DIAGNOSIS — E7849 Other hyperlipidemia: Secondary | ICD-10-CM | POA: Diagnosis not present

## 2023-07-18 ENCOUNTER — Other Ambulatory Visit: Payer: Self-pay | Admitting: Family Medicine

## 2023-07-18 DIAGNOSIS — Z0000 Encounter for general adult medical examination without abnormal findings: Secondary | ICD-10-CM

## 2023-07-23 DIAGNOSIS — I129 Hypertensive chronic kidney disease with stage 1 through stage 4 chronic kidney disease, or unspecified chronic kidney disease: Secondary | ICD-10-CM | POA: Diagnosis not present

## 2023-07-23 DIAGNOSIS — Z0000 Encounter for general adult medical examination without abnormal findings: Secondary | ICD-10-CM | POA: Diagnosis not present

## 2023-07-23 DIAGNOSIS — I10 Essential (primary) hypertension: Secondary | ICD-10-CM | POA: Diagnosis not present

## 2023-07-23 DIAGNOSIS — Z6829 Body mass index (BMI) 29.0-29.9, adult: Secondary | ICD-10-CM | POA: Diagnosis not present

## 2023-07-23 DIAGNOSIS — E785 Hyperlipidemia, unspecified: Secondary | ICD-10-CM | POA: Diagnosis not present

## 2023-07-23 DIAGNOSIS — R413 Other amnesia: Secondary | ICD-10-CM | POA: Diagnosis not present

## 2023-07-23 DIAGNOSIS — N183 Chronic kidney disease, stage 3 unspecified: Secondary | ICD-10-CM | POA: Diagnosis not present

## 2023-07-23 DIAGNOSIS — E1169 Type 2 diabetes mellitus with other specified complication: Secondary | ICD-10-CM | POA: Diagnosis not present

## 2023-07-25 ENCOUNTER — Ambulatory Visit
Admission: RE | Admit: 2023-07-25 | Discharge: 2023-07-25 | Disposition: A | Discharge status: Home / Self Care | Payer: Medicare PPO | Source: Ambulatory Visit | Attending: Family Medicine | Admitting: Family Medicine

## 2023-07-25 DIAGNOSIS — Z0000 Encounter for general adult medical examination without abnormal findings: Secondary | ICD-10-CM

## 2023-07-25 DIAGNOSIS — Z1231 Encounter for screening mammogram for malignant neoplasm of breast: Secondary | ICD-10-CM | POA: Diagnosis not present

## 2023-12-01 DIAGNOSIS — Z6829 Body mass index (BMI) 29.0-29.9, adult: Secondary | ICD-10-CM | POA: Diagnosis not present

## 2023-12-01 DIAGNOSIS — E559 Vitamin D deficiency, unspecified: Secondary | ICD-10-CM | POA: Diagnosis not present

## 2023-12-01 DIAGNOSIS — F99 Mental disorder, not otherwise specified: Secondary | ICD-10-CM | POA: Diagnosis not present

## 2023-12-01 DIAGNOSIS — J441 Chronic obstructive pulmonary disease with (acute) exacerbation: Secondary | ICD-10-CM | POA: Diagnosis not present

## 2023-12-01 DIAGNOSIS — I10 Essential (primary) hypertension: Secondary | ICD-10-CM | POA: Diagnosis not present

## 2023-12-01 DIAGNOSIS — R82994 Hypercalciuria: Secondary | ICD-10-CM | POA: Diagnosis not present

## 2023-12-01 DIAGNOSIS — R251 Tremor, unspecified: Secondary | ICD-10-CM | POA: Diagnosis not present

## 2023-12-01 DIAGNOSIS — E089 Diabetes mellitus due to underlying condition without complications: Secondary | ICD-10-CM | POA: Diagnosis not present

## 2023-12-08 ENCOUNTER — Other Ambulatory Visit (HOSPITAL_COMMUNITY): Payer: Self-pay | Admitting: Family Medicine

## 2023-12-08 DIAGNOSIS — E0590 Thyrotoxicosis, unspecified without thyrotoxic crisis or storm: Secondary | ICD-10-CM

## 2023-12-08 DIAGNOSIS — I10 Essential (primary) hypertension: Secondary | ICD-10-CM | POA: Diagnosis not present

## 2023-12-08 DIAGNOSIS — E210 Primary hyperparathyroidism: Secondary | ICD-10-CM | POA: Diagnosis not present

## 2023-12-08 DIAGNOSIS — E7849 Other hyperlipidemia: Secondary | ICD-10-CM | POA: Diagnosis not present

## 2023-12-18 ENCOUNTER — Encounter (HOSPITAL_COMMUNITY): Admission: RE | Admit: 2023-12-18 | Source: Ambulatory Visit

## 2023-12-18 ENCOUNTER — Encounter (HOSPITAL_COMMUNITY)

## 2023-12-18 ENCOUNTER — Encounter (HOSPITAL_COMMUNITY): Payer: Self-pay

## 2024-01-05 ENCOUNTER — Encounter (HOSPITAL_COMMUNITY)
Admission: RE | Admit: 2024-01-05 | Discharge: 2024-01-05 | Disposition: A | Discharge status: Home / Self Care | Source: Ambulatory Visit | Attending: Family Medicine | Admitting: Family Medicine

## 2024-01-05 DIAGNOSIS — E0590 Thyrotoxicosis, unspecified without thyrotoxic crisis or storm: Secondary | ICD-10-CM | POA: Diagnosis not present

## 2024-01-05 DIAGNOSIS — E210 Primary hyperparathyroidism: Secondary | ICD-10-CM | POA: Diagnosis not present

## 2024-01-05 MED ORDER — TECHNETIUM TC 99M SESTAMIBI - CARDIOLITE
25.0000 | Freq: Once | INTRAVENOUS | Status: AC | PRN
  Administered 2024-01-05: 25 via INTRAVENOUS

## 2024-03-19 DIAGNOSIS — E215 Disorder of parathyroid gland, unspecified: Secondary | ICD-10-CM | POA: Diagnosis not present

## 2024-03-19 DIAGNOSIS — R82994 Hypercalciuria: Secondary | ICD-10-CM | POA: Diagnosis not present

## 2024-03-19 DIAGNOSIS — E210 Primary hyperparathyroidism: Secondary | ICD-10-CM | POA: Diagnosis not present

## 2024-03-19 DIAGNOSIS — Z23 Encounter for immunization: Secondary | ICD-10-CM | POA: Diagnosis not present

## 2024-03-19 DIAGNOSIS — I10 Essential (primary) hypertension: Secondary | ICD-10-CM | POA: Diagnosis not present

## 2024-07-14 ENCOUNTER — Encounter: Payer: Self-pay | Admitting: *Deleted

## 2024-07-14 NOTE — Progress Notes (Signed)
 THANH POMERLEAU                                          MRN: [id number]   07/14/2024   The VBCI Quality Team Specialist reviewed this patient medical record for the purposes of chart review for care gap closure. The following were reviewed: chart review for care gap closure-controlling blood pressure and kidney health evaluation for diabetes:eGFR  and uACR.    VBCI Quality Team
